# Patient Record
Sex: Female | Born: 2000
Health system: Southern US, Community
[De-identification: ages and names within clinical notes are randomized; demographics above are authoritative.]

## PROBLEM LIST (undated history)

## (undated) DIAGNOSIS — N6320 Unspecified lump in the left breast, unspecified quadrant: Secondary | ICD-10-CM

## (undated) DIAGNOSIS — G43109 Migraine with aura, not intractable, without status migrainosus: Secondary | ICD-10-CM

## (undated) HISTORY — DX: Unspecified lump in the left breast, unspecified quadrant: N63.20

## (undated) HISTORY — DX: Migraine with aura, not intractable, without status migrainosus: G43.109

---
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
## 2019-06-14 ENCOUNTER — Telehealth: Payer: Self-pay

## 2019-06-14 NOTE — Telephone Encounter (Signed)
Patient is in 04 recall for left breast ultrasound. Please contact patient to schedule appointment.

## 2019-06-29 ENCOUNTER — Other Ambulatory Visit: Payer: Self-pay | Admitting: Obstetrics and Gynecology

## 2019-06-29 DIAGNOSIS — N6320 Unspecified lump in the left breast, unspecified quadrant: Secondary | ICD-10-CM

## 2019-06-29 NOTE — Telephone Encounter (Signed)
Spoke with patient and advised it was time to scheduled follow up ultrasound of left breast with [facility].  Patient states she will call today to schedule. Made AEX appointment for 07-11-19 3:00pm.

## 2019-06-30 NOTE — Telephone Encounter (Signed)
Patient scheduled for Lt.Br.U/S 07-08-19 at [facility].

## 2019-07-06 NOTE — Progress Notes (Signed)
18 y.o. G0P0000 Single Caucasian female here for annual exam.    Patient wants to discuss [facility] options;possibly an IUD. She stopped Depo Provera.  Has HA, nausea, cramping, and heavy flow with her cycles.  She stopped Depo Provera due to Covid and not wanting to go to the office for injections. She likes this better than taking a pill.  Her HAs are random but are really painful on her cycles.  She has light sensitivity.  She has an aura.    Student at [employer].   PCP: Jamal Collin, MD   Patient's last menstrual period was 07/07/2019 (exact date).     Period Duration (Days): 7 days Period Pattern: (!) Irregular Menstrual Flow: Heavy Menstrual Control: Maxi pad Menstrual Control Change Freq (Hours): every 3 hours on heaviest day Dysmenorrhea: (!) Moderate(occ severe cramps) Dysmenorrhea Symptoms: Cramping, Nausea, Diarrhea, Headache     Sexually active: No.  The current method of family planning is abstinence    Exercising: Yes.    light cardio. Smoker:  no  Health Maintenance: Pap:  n/a History of abnormal Pap:  n/a MMG: 07-11-19 LT.Br.US--Stable to slight decrease in size of the approximate 7 mm mass;likely a benign fibroadenoma. As the LEFT breast mass has been stable for greater than 2 years, no further imaging follow-up is necessary./BiRads2 Colonoscopy:  n/a BMD:   n/a  Result  n/a TDaP: 11-01-10 Gardasil:   yes HIV:no Hep C:no Screening Labs:  Today.  Flu vaccine completed.   reports that she has never smoked. She has never used smokeless tobacco. She reports current alcohol use of about 1.0 standard drinks of alcohol per week. She reports that she does not use drugs.  Past Medical History:  Diagnosis Date  . Left breast mass    fibroadenoma on imaging    History reviewed. No pertinent surgical history.  Current Outpatient Medications  Medication Sig Dispense Refill  . DULoxetine (CYMBALTA) 60 MG capsule Take 60 mg by mouth daily.    . montelukast  (SINGULAIR) 10 MG tablet Take 10 mg by mouth daily.    Marland Kitchen omeprazole (PRILOSEC) 40 MG capsule Take 40 mg by mouth daily.    Marland Kitchen PROAIR RESPICLICK 108 (90 Base) MCG/ACT AEPB Inhale 1 puff into the lungs as needed.     No current facility-administered medications for this visit.    Family History  Problem Relation Age of Onset  . Asthma Mother   . Endometriosis Mother   . Crohn's disease Sister   . Diabetes Maternal Aunt   . Kidney cancer Paternal Grandfather   . Bone cancer Paternal Grandfather   . Diabetes Other   . Diabetes Other     Review of Systems  All other systems reviewed and are negative.   Exam:   BP 126/74   Pulse 90   Temp 98.1 F (36.7 C) (Temporal)   Resp 16   Ht 5\' 7"  (1.702 m)   Wt 170 lb 3.2 oz (77.2 kg)   LMP 07/07/2019 (Exact Date)   BMI 26.66 kg/m     General appearance: alert, cooperative and appears stated age Head: normocephalic, without obvious abnormality, atraumatic Neck: no adenopathy, supple, symmetrical, trachea midline and thyroid normal to inspection and palpation Lungs: clear to auscultation bilaterally Breasts: normal appearance, no masses or tenderness, No nipple retraction or dimpling, No nipple discharge or bleeding, No axillary adenopathy Heart: regular rate and rhythm Abdomen: soft, non-tender; no masses, no organomegaly Extremities: extremities normal, atraumatic, no cyanosis or edema Skin: skin  color, texture, turgor normal. No rashes or lesions Lymph nodes: cervical, supraclavicular, and axillary nodes normal. Neurologic: grossly normal  Pelvic: External genitalia:  no lesions              No abnormal inguinal nodes palpated.              Urethra:  normal appearing urethra with no masses, tenderness or lesions              Bartholins and Skenes: normal                 Vagina: normal appearing vagina with normal color and discharge, no lesions              Cervix: no lesions              Pap taken: No. Bimanual Exam:  Uterus:   normal size, contour, position, consistency, mobility, non-tender              Adnexa: no mass, fullness, tenderness                Chaperone was present for exam.  Assessment:   Well woman visit with normal exam. 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  Migraine with aura.   Plan: Mammogram screening discussed. Self breast awareness reviewed. Pap and HR HPV as above. Guidelines for Calcium, Vitamin D, regular exercise program including cardiovascular and weight bearing exercise. Routine labs.  She will return for a progesterone IUD.  Risks and benefits of Kyleena and Mirena discussed. Brochures to patient.  Will plan for Cytotec 200 mcg pv at hs the night before and then am of the procedure. She will see her PCP for her migraine HA.  Follow up annually and prn.   After visit summary provided.

## 2019-07-08 ENCOUNTER — Other Ambulatory Visit: Payer: Self-pay

## 2019-07-11 ENCOUNTER — Ambulatory Visit
Admission: RE | Admit: 2019-07-11 | Discharge: 2019-07-11 | Disposition: A | Discharge status: Home / Self Care | Payer: Commercial Managed Care - PPO | Source: Ambulatory Visit | Attending: Obstetrics and Gynecology | Admitting: Obstetrics and Gynecology

## 2019-07-11 ENCOUNTER — Other Ambulatory Visit: Payer: Self-pay

## 2019-07-11 ENCOUNTER — Encounter: Payer: Self-pay | Admitting: Obstetrics and Gynecology

## 2019-07-11 ENCOUNTER — Ambulatory Visit: Payer: Commercial Managed Care - PPO | Admitting: Obstetrics and Gynecology

## 2019-07-11 VITALS — BP 126/74 | HR 90 | Temp 98.1°F | Resp 16 | Ht 67.0 in | Wt 170.2 lb

## 2019-07-11 DIAGNOSIS — Z3009 Encounter for other general counseling and advice on contraception: Secondary | ICD-10-CM

## 2019-07-11 DIAGNOSIS — Z01419 Encounter for gynecological examination (general) (routine) without abnormal findings: Secondary | ICD-10-CM

## 2019-07-11 DIAGNOSIS — N6320 Unspecified lump in the left breast, unspecified quadrant: Secondary | ICD-10-CM

## 2019-07-11 NOTE — Patient Instructions (Signed)

## 2019-07-12 LAB — COMPREHENSIVE METABOLIC PANEL
ALT: 35 IU/L — ABNORMAL HIGH (ref 0–32)
AST: 24 IU/L (ref 0–40)
Albumin/Globulin Ratio: 1.8 (ref 1.2–2.2)
Albumin: 4.5 g/dL (ref 3.9–5.0)
Alkaline Phosphatase: 94 IU/L (ref 39–117)
BUN/Creatinine Ratio: 14 (ref 9–23)
BUN: 10 mg/dL (ref 6–20)
Bilirubin Total: 0.7 mg/dL (ref 0.0–1.2)
CO2: 23 mmol/L (ref 20–29)
Calcium: 9.5 mg/dL (ref 8.7–10.2)
Chloride: 102 mmol/L (ref 96–106)
Creatinine, Ser: 0.69 mg/dL (ref 0.57–1.00)
GFR calc Af Amer: 146 mL/min/{1.73_m2} (ref >59)
GFR calc non Af Amer: 127 mL/min/{1.73_m2} (ref >59)
Globulin, Total: 2.5 g/dL (ref 1.5–4.5)
Glucose: 96 mg/dL (ref 65–99)
Potassium: 3.7 mmol/L (ref 3.5–5.2)
Sodium: 139 mmol/L (ref 134–144)
Total Protein: 7 g/dL (ref 6.0–8.5)

## 2019-07-12 LAB — CBC
Hematocrit: 41.8 % (ref 34.0–46.6)
Hemoglobin: 13.8 g/dL (ref 11.1–15.9)
MCH: 28 pg (ref 26.6–33.0)
MCHC: 33 g/dL (ref 31.5–35.7)
MCV: 85 fL (ref 79–97)
Platelets: 383 10*3/uL (ref 150–450)
RBC: 4.93 x10E6/uL (ref 3.77–5.28)
RDW: 12.6 % (ref 11.7–15.4)
WBC: 8.2 10*3/uL (ref 3.4–10.8)

## 2019-07-12 LAB — TSH: TSH: 1.61 u[IU]/mL (ref 0.450–4.500)

## 2019-07-13 NOTE — Telephone Encounter (Signed)
07-11-19 removed from all mammogram hold and mammogram recall per Dr.Silva. Return to routine screening.BS/JH/Ald

## 2019-07-18 LAB — LIPID PANEL
Chol/HDL Ratio: 3 ratio (ref 0.0–4.4)
Cholesterol, Total: 184 mg/dL — ABNORMAL HIGH (ref 100–169)
HDL: 61 mg/dL (ref >39)
LDL Chol Calc (NIH): 101 mg/dL (ref 0–109)
Triglycerides: 123 mg/dL — ABNORMAL HIGH (ref 0–89)
VLDL Cholesterol Cal: 22 mg/dL (ref 5–40)

## 2019-07-18 LAB — SPECIMEN STATUS REPORT

## 2019-07-19 ENCOUNTER — Encounter: Payer: Self-pay | Admitting: Obstetrics and Gynecology

## 2019-07-20 ENCOUNTER — Other Ambulatory Visit: Payer: Self-pay | Admitting: Obstetrics and Gynecology

## 2019-07-20 DIAGNOSIS — E7800 Pure hypercholesterolemia, unspecified: Secondary | ICD-10-CM

## 2019-07-20 DIAGNOSIS — R748 Abnormal levels of other serum enzymes: Secondary | ICD-10-CM

## 2019-07-24 ENCOUNTER — Encounter: Payer: Self-pay | Admitting: Obstetrics and Gynecology

## 2019-07-25 ENCOUNTER — Telehealth: Payer: Self-pay | Admitting: Obstetrics and Gynecology

## 2019-07-25 NOTE — Telephone Encounter (Signed)
Patient sent the following correspondence through MyChart.  Hi,  I was wondering where we were on the process of me getting my IUD. I go back to school later this month and would prefer to have it done before then if that is at all possible.  Thank you  Cc: Thomasene Lot and Clotilde Dieter

## 2019-07-27 ENCOUNTER — Other Ambulatory Visit: Payer: Self-pay | Admitting: Obstetrics and Gynecology

## 2019-07-27 DIAGNOSIS — Z30430 Encounter for insertion of intrauterine contraceptive device: Secondary | ICD-10-CM

## 2019-07-27 DIAGNOSIS — Z3009 Encounter for other general counseling and advice on contraception: Secondary | ICD-10-CM

## 2019-07-27 NOTE — Telephone Encounter (Signed)
Spoke with pt. Pt wanting to have IUD insertion with Dr Edward Jolly before going back to school on 08/07/2019. Pt scheduled IUD insertion for 08/01/2019 at 1:30pm. Pt aware of call for benefits. Pt instructed to eat and drink and take Motrin 800 mg with food and water one hour before procedure. Pt also mentioned from Dr Courtney Paris OV about taking Cytotec for procedure. Pt also to remain abstinent til appt. Pt verbalized understanding.  Cytotec orders pended if approved. #2, 0RF  Will route to Dr Edward Jolly for review.  Cc: Rosa for update on appt made.

## 2019-07-27 NOTE — Telephone Encounter (Signed)
Call placed to convey benefits for iud insertion. Spoke with the patient and conveyed the benefits. Patient understands/agreeable with the benefits. Patient is unsure when she can schedule the appointment. She will go back to school later this month. °

## 2019-07-27 NOTE — Telephone Encounter (Signed)
Call placed to convey benefits for iud insertion. Spoke with the patient and conveyed the benefits. Patient understands/agreeable with the benefits. Patient is unsure when she can schedule the appointment. She will go back to school later this month.

## 2019-07-28 MED ORDER — MISOPROSTOL 200 MCG PO TABS: ORAL_TABLET | ORAL | 0 refills | Status: DC

## 2019-07-29 ENCOUNTER — Other Ambulatory Visit: Payer: Self-pay

## 2019-08-01 ENCOUNTER — Encounter: Payer: Self-pay | Admitting: Obstetrics and Gynecology

## 2019-08-01 ENCOUNTER — Other Ambulatory Visit: Payer: Self-pay

## 2019-08-01 ENCOUNTER — Ambulatory Visit (INDEPENDENT_AMBULATORY_CARE_PROVIDER_SITE_OTHER): Payer: Commercial Managed Care - PPO | Admitting: Obstetrics and Gynecology

## 2019-08-01 VITALS — BP 104/64 | HR 110 | Temp 97.1°F | Ht 67.0 in | Wt 174.0 lb

## 2019-08-01 DIAGNOSIS — Z3009 Encounter for other general counseling and advice on contraception: Secondary | ICD-10-CM

## 2019-08-01 DIAGNOSIS — Z01812 Encounter for preprocedural laboratory examination: Secondary | ICD-10-CM | POA: Diagnosis not present

## 2019-08-01 LAB — POCT URINE PREGNANCY: Preg Test, Ur: NEGATIVE

## 2019-08-01 NOTE — Progress Notes (Signed)
GYNECOLOGY  VISIT   HPI: 19 y.o.   Single  Caucasian  female   G0P0000 with Patient's last menstrual period was 07/07/2019 (exact date).   here for IUD insertion.  She used Cytotec last hs and this am.  She took Motrin 800 mg prior the visit.  Last meal at 1:00 pm.   UPT: Neg  GYNECOLOGIC HISTORY: Patient's last menstrual period was 07/07/2019 (exact date). 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 As the LEFT breast mass has been stable for greater than 2 years, no further imaging follow-up is necessary./BiRads2 Last pap smear:   n/a        OB History    Gravida  0   Para  0   Term  0   Preterm  0   AB  0   Living  0     SAB  0   TAB  0   Ectopic  0   Multiple  0   Live Births  0              Patient Active Problem List   Diagnosis Date Noted  . Back pain 01/01/2018  . Dysmenorrhea 04/14/2017  . Influenza A 08/20/2016    Past Medical History:  Diagnosis Date  . Elevated ALT measurement 2020  . 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 Migraine with aura     History reviewed. No pertinent surgical history.  Current Outpatient Medications  Medication Sig Dispense Refill  . DULoxetine (CYMBALTA) 60 MG capsule Take 60 mg by mouth daily.    . misoprostol (CYTOTEC) 200 MCG tablet Insert 1 tablet vaginally the night before procedure, then 1 tablet vaginally the morning of procedure. 2 tablet 0  . montelukast (SINGULAIR) 10 MG tablet Take 10 mg by mouth daily.    Marland Kitchen omeprazole (PRILOSEC) 40 MG capsule Take 40 mg by mouth daily.    Marland Kitchen PROAIR RESPICLICK 108 (90 Base) MCG/ACT AEPB Inhale 1 puff into the lungs as needed.     No current facility-administered medications for this visit.     ALLERGIES: Patient has no known allergies.  Family History  Problem Relation Age of Onset  . Asthma Mother   .  Endometriosis Mother   . Crohn's disease Sister   . Diabetes Maternal Aunt   . Kidney cancer Paternal Grandfather   . Bone cancer Paternal Grandfather   . Diabetes Other   . Diabetes Other     Social History   Socioeconomic History  . Marital status: Single    Spouse name: Not on file  . Number of children: Not on file  . Years of education: Not on file  . Highest education level: Not on file  Occupational History  . Not on file  Tobacco Use  . Smoking status: Never Smoker  . Smokeless tobacco: Never Used  Substance and Sexual Activity  . Alcohol use: Yes    Alcohol/week: 1.0 standard drinks    Types: 1 Cans of beer per week  . Drug use: No  . Sexual activity: Never    Birth control/protection: Abstinence  Other Topics Concern  . Not on file  Social History Narrative  . Not on file   Social Determinants of Health   Financial Resource Strain:   . Difficulty of Paying Living Expenses: Not on file  Food Insecurity:   . Worried  About Running Out of Food in the Last Year: Not on file  . Ran Out of Food in the Last Year: Not on file  Transportation Needs:   . Lack of Transportation (Medical): Not on file  . Lack of Transportation (Non-Medical): Not on file  Physical Activity:   . Days of Exercise per Week: Not on file  . Minutes of Exercise per Session: Not on file  Stress:   . Feeling of Stress : Not on file  Social Connections:   . Frequency of Communication with Friends and Family: Not on file  . Frequency of Social Gatherings with Friends and Family: Not on file  . Attends Religious Services: Not on file  . Active Member of Clubs or Organizations: Not on file  . Attends Archivist Meetings: Not on file  . Marital Status: Not on file  Intimate Partner Violence:   . Fear of Current or Ex-Partner: Not on file  . Emotionally Abused: Not on file  . Physically Abused: Not on file  . Sexually Abused: Not on file    Review of Systems See HPI.  PHYSICAL  EXAMINATION:    BP 104/64   Pulse (!) 110   Temp (!) 97.1 F (36.2 C) (Temporal)   Ht 5\' 7"  (1.702 m)   Wt 174 lb (78.9 kg)   LMP 07/07/2019 (Exact Date)   BMI 27.25 kg/m     General appearance: alert, cooperative and appears stated age   Pelvic: External genitalia:  no lesions              Urethra:  normal appearing urethra with no masses, tenderness or lesions              Bartholins and Skenes: normal                 Vagina: normal appearing vagina with normal color and discharge, no lesions              Cervix: no lesions                Bimanual Exam:  Uterus:  normal size, contour, position, consistency, mobility, non-tender              Adnexa: no mass, fullness, tenderness             Consent for Mirena IUD insertion.  Lot [id number], expiration Dec 2022. Speculum placed in vagina.  Sterile prep of cervix with Hibiclens.  Paracervical block with 10 cc 1% lidocaine - lot [id number], expiration 4/22. Tenaculum to anterior cervical lip.  Uterus sounded to just over 7 cm.  Mirena IUD placed without difficulty.  Strings trimmed and shown to patient.  Repeat bimanual exam, no change. No complications.  Minimal EBL.  Chaperone:  Marisa Sprinkles present  ASSESSMENT  Mirena IUD insertion.   PLAN  Instructions and precautions given.  Back up contraception discussed. Card given to patient with insertion date, recommended removal date, and lot number.  Follow up for a recheck in 4 weeks, sooner as needed.  After visit summary to patient.

## 2019-08-01 NOTE — Patient Instructions (Signed)
Intrauterine Device Insertion, Care After  This sheet gives you information about how to care for yourself after your procedure. Your health care provider may also give you more specific instructions. If you have problems or questions, contact your health care provider. What can I expect after the procedure? After the procedure, it is common to have:  Cramps and pain in the abdomen.  Light bleeding (spotting) or heavier bleeding that is like your menstrual period. This may last for up to a few days.  Lower back pain.  Dizziness.  Headaches.  Nausea. Follow these instructions at home:  Before resuming sexual activity, check to make sure that you can feel the IUD string(s). You should be able to feel the end of the string(s) below the opening of your cervix. If your IUD string is in place, you may resume sexual activity. ? If you had a hormonal IUD inserted more than 7 days after your most recent period started, you will need to use a backup method of birth control for 7 days after IUD insertion. Ask your health care provider whether this applies to you.  Continue to check that the IUD is still in place by feeling for the string(s) after every menstrual period, or once a month.  Take over-the-counter and prescription medicines only as told by your health care provider.  Do not drive or use heavy machinery while taking prescription pain medicine.  Keep all follow-up visits as told by your health care provider. This is important. Contact a health care provider if:  You have bleeding that is heavier or lasts longer than a normal menstrual cycle.  You have a fever.  You have cramps or abdominal pain that get worse or do not get better with medicine.  You develop abdominal pain that is new or is not in the same area of earlier cramping and pain.  You feel lightheaded or weak.  You have abnormal or bad-smelling discharge from your vagina.  You have pain during sexual  activity.  You have any of the following problems with your IUD string(s): ? The string bothers or hurts you or your sexual partner. ? You cannot feel the string. ? The string has gotten longer.  You can feel the IUD in your vagina.  You think you may be pregnant, or you miss your menstrual period.  You think you may have an STI (sexually transmitted infection). Get help right away if:  You have flu-like symptoms.  You have a fever and chills.  You can feel that your IUD has slipped out of place. Summary  After the procedure, it is common to have cramps and pain in the abdomen. It is also common to have light bleeding (spotting) or heavier bleeding that is like your menstrual period.  Continue to check that the IUD is still in place by feeling for the string(s) after every menstrual period, or once a month.  Keep all follow-up visits as told by your health care provider. This is important.  Contact your health care provider if you have problems with your IUD string(s), such as the string getting longer or bothering you or your sexual partner. This information is not intended to replace advice given to you by your health care provider. Make sure you discuss any questions you have with your health care provider. Document Revised: 06/19/2017 Document Reviewed: 05/28/2016 Elsevier Patient Education  2020 [employer]  

## 2019-08-17 ENCOUNTER — Telehealth: Payer: Self-pay | Admitting: Obstetrics and Gynecology

## 2019-08-17 ENCOUNTER — Encounter: Payer: Self-pay | Admitting: Obstetrics and Gynecology

## 2019-08-17 NOTE — Telephone Encounter (Signed)
Spoke with patient, advised per Dr. Silva. Patient verbalizes understanding and is agreeable.  Encounter closed.  

## 2019-08-17 NOTE — Telephone Encounter (Signed)
Spoke with patient. Mirena IUD placed 08/01/19. Menses started on 08/07/19 to date. Flow varies, currently changing regular tampon q2-3 hours. Menses like cramps 7/10, worse when sitting, taking ibuprofen prn, this provides relief. Reports headache and nausea with cramps, no vomiting. Denies fever/chills. Recommended OV for further evaluation, patient declines OV this week due to potential inclement weather, lives in [place]. OV scheduled for 08/22/19 at 11:30am with Dr. Edward Jolly. Advised patient if bleeding becomes heavy, changing saturated tampon/pad q1-2 hours or severe pain, seek immediate evaluation at local ER or Urgent Care. Advised patient to return call to office if she desires earlier OV. Will review with Dr. Edward Jolly and return call if any additional recommendations. Patient agreeable.   Routing to Dr. Edward Jolly.

## 2019-08-17 NOTE — Telephone Encounter (Signed)
I recommend ibuprofen 800 mg every 8 hours as needed.  I would also have her try a heating pad.  I will be happy to see her next week.  I agree with your recommendations for care through an ER or urgent care in [facility] for pain and bleeding if needed.

## 2019-08-17 NOTE — Telephone Encounter (Signed)
Patient sent the following correspondence through MyChart.  I got my first period since the IUD on the 17th and I've had it since. It's been very heavy and my cramps and headaches have been bad. I'm concerned that the cramps and bleeding haven't stopped.

## 2019-08-18 ENCOUNTER — Other Ambulatory Visit: Payer: Self-pay

## 2019-08-18 NOTE — Progress Notes (Deleted)
GYNECOLOGY  VISIT   HPI: 19 y.o.   Single  Caucasian  female   G0P0000 with No LMP recorded.   here for follow up after IUD insertion.   GYNECOLOGIC HISTORY: No LMP recorded. 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 Back pain 01/01/2018  . Dysmenorrhea 04/14/2017  . Influenza A 08/20/2016    Past Medical History:  Diagnosis Date  . Elevated ALT measurement 2020  . 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 Migraine with aura     No past surgical history on file.  Current Outpatient Medications  Medication Sig Dispense Refill  . DULoxetine (CYMBALTA) 60 MG capsule Take 60 mg by mouth daily.    . misoprostol (CYTOTEC) 200 MCG tablet Insert 1 tablet vaginally the night before procedure, then 1 tablet vaginally the morning of procedure. 2 tablet 0  . montelukast (SINGULAIR) 10 MG tablet Take 10 mg by mouth daily.    Marland Kitchen omeprazole (PRILOSEC) 40 MG capsule Take 40 mg by mouth daily.    Marland Kitchen PROAIR RESPICLICK 108 (90 Base) MCG/ACT AEPB Inhale 1 puff into the lungs as needed.     No current facility-administered medications for this visit.     ALLERGIES: Patient has no known allergies.  Family History  Problem Relation Age of Onset  . Asthma Mother   . Endometriosis Mother   . Crohn's disease Sister   . Diabetes Maternal Aunt   . Kidney cancer Paternal Grandfather   . Bone cancer Paternal Grandfather   . Diabetes Other   . Diabetes Other      Social History   Socioeconomic History  . Marital status: Single    Spouse name: Not on file  . Number of children: Not on file  . Years of education: Not on file  . Highest education level: Not on file  Occupational History  . Not on file  Tobacco Use  . Smoking status: Never Smoker  . Smokeless tobacco: Never Used  Substance and Sexual Activity  . Alcohol use: Yes    Alcohol/week: 1.0 standard drinks    Types: 1 Cans of beer per week  . Drug use: No  . Sexual activity: Never    Birth control/protection: Abstinence  Other Topics Concern  . Not on file  Social History Narrative  . Not on file   Social Determinants of Health   Financial Resource Strain:   . Difficulty of Paying Living Expenses: Not on file  Food Insecurity:   . Worried About Programme researcher, broadcasting/film/video in the Last Year: Not on file  . Ran Out of Food in the Last Year: Not on file  Transportation Needs:   . Lack of Transportation (Medical):  Not on file  . Lack of Transportation (Non-Medical): Not on file  Physical Activity:   . Days of Exercise per Week: Not on file  . Minutes of Exercise per Session: Not on file  Stress:   . Feeling of Stress : Not on file  Social Connections:   . Frequency of Communication with Friends and Family: Not on file  . Frequency of Social Gatherings with Friends and Family: Not on file  . Attends Religious Services: Not on file  . Active Member of Clubs or Organizations: Not on file  . Attends Archivist Meetings: Not on file  . Marital Status: Not on file  Intimate Partner Violence:   . Fear of Current or Ex-Partner: Not on file  . Emotionally Abused: Not on file  . Physically Abused: Not on file  . Sexually Abused: Not on file    Review of Systems  PHYSICAL EXAMINATION:    There were no vitals taken for this visit.    General appearance: alert, cooperative and appears stated age Head: Normocephalic, without obvious abnormality, atraumatic Neck: no  adenopathy, supple, symmetrical, trachea midline and thyroid normal to inspection and palpation Lungs: clear to auscultation bilaterally Breasts: normal appearance, no masses or tenderness, No nipple retraction or dimpling, No nipple discharge or bleeding, No axillary or supraclavicular adenopathy Heart: regular rate and rhythm Abdomen: soft, non-tender, no masses,  no organomegaly Extremities: extremities normal, atraumatic, no cyanosis or edema Skin: Skin color, texture, turgor normal. No rashes or lesions Lymph nodes: Cervical, supraclavicular, and axillary nodes normal. No abnormal inguinal nodes palpated Neurologic: Grossly normal  Pelvic: External genitalia:  no lesions              Urethra:  normal appearing urethra with no masses, tenderness or lesions              Bartholins and Skenes: normal                 Vagina: normal appearing vagina with normal color and discharge, no lesions              Cervix: no lesions                Bimanual Exam:  Uterus:  normal size, contour, position, consistency, mobility, non-tender              Adnexa: no mass, fullness, tenderness              Rectal exam: {yes no:[id number]}.  Confirms.              Anus:  normal sphincter tone, no lesions  Chaperone was present for exam.  ASSESSMENT     PLAN     An After Visit Summary was printed and given to the patient.  ______ minutes face to face time of which over 50% was spent in counseling.

## 2019-08-22 ENCOUNTER — Telehealth: Payer: Self-pay | Admitting: Obstetrics and Gynecology

## 2019-08-22 ENCOUNTER — Ambulatory Visit: Payer: Self-pay | Admitting: Obstetrics and Gynecology

## 2019-08-22 NOTE — Telephone Encounter (Signed)
Patient cancelled today's appointment due to possible covid exposure. States she was coming in for a problem visit, but the issues she was having have gone away. Keeping appointment for 08/29/19 at this time, will get update during covid prescreen call for this appointment.

## 2019-08-22 NOTE — Telephone Encounter (Signed)
Thank you for the update!

## 2019-08-25 NOTE — Progress Notes (Signed)
GYNECOLOGY  VISIT   HPI: 19 y.o.   Single  Caucasian  female   G0P0000 with Patient's last menstrual period was 08/07/2019 (exact date).   here for 4 week follow up after Mirena IUD insertion.  She is also having fasting labs. Her total cholesterol, triglycerides, at ALT were elevated on 07/11/19.   Period lasted 2.5 weeks.  Occasional spotting and cramping.   GYNECOLOGIC HISTORY: Patient's last menstrual period was 08/07/2019 (exact date). Contraception:  Mirena IUD 08-01-19 Menopausal hormone therapy:  n/a Last mammogram:  n/a Last pap smear: n/a        OB History    Gravida  0   Para  0   Term  0   Preterm  0   AB  0   Living  0     SAB  0   TAB  0   Ectopic  0   Multiple  0   Live Births  0              Patient Active Problem List   Diagnosis Date Noted  . Back pain 01/01/2018  . Dysmenorrhea 04/14/2017  . Influenza A 08/20/2016    Past Medical History:  Diagnosis Date  . Elevated ALT measurement 2020  . 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 Migraine with aura     History reviewed. No pertinent surgical history.  Current Outpatient Medications  Medication Sig Dispense Refill  . ARIPiprazole (ABILIFY) 2 MG tablet Take 1 tablet by mouth daily.    . DULoxetine (CYMBALTA) 60 MG capsule Take 60 mg by mouth daily.    . montelukast (SINGULAIR) 10 MG tablet Take 10 mg by mouth daily.    Marland Kitchen omeprazole (PRILOSEC) 40 MG capsule Take 40 mg by mouth daily.    Marland Kitchen PROAIR RESPICLICK 108 (90 Base) MCG/ACT AEPB Inhale 1 puff into the lungs as needed.     No current facility-administered medications for this visit.     ALLERGIES: Patient has no known allergies.  Family History  Problem Relation Age of Onset  . Asthma Mother   . Endometriosis Mother   . Crohn's disease Sister   . Diabetes Maternal Aunt   . Kidney cancer Paternal Grandfather   . Bone cancer Paternal Grandfather   . Diabetes Other   . Diabetes Other     Social History    Socioeconomic History  . Marital status: Single    Spouse name: Not on file  . Number of children: Not on file  . Years of education: Not on file  . Highest education level: Not on file  Occupational History  . Not on file  Tobacco Use  . Smoking status: Never Smoker  . Smokeless tobacco: Never Used  Substance and Sexual Activity  . Alcohol use: Yes    Alcohol/week: 1.0 standard drinks    Types: 1 Cans of beer per week  . Drug use: No  . Sexual activity: Never    Birth control/protection: Abstinence  Other Topics Concern  . Not on file  Social History Narrative  . Not on file   Social Determinants of Health   Financial Resource Strain:   . Difficulty of Paying Living Expenses: Not on file  Food Insecurity:   . Worried About Programme researcher, broadcasting/film/video in the Last Year: Not on file  . Ran Out of Food in the Last Year: Not on file  Transportation Needs:   . Lack of Transportation (Medical): Not  on file  . Lack of Transportation (Non-Medical): Not on file  Physical Activity:   . Days of Exercise per Week: Not on file  . Minutes of Exercise per Session: Not on file  Stress:   . Feeling of Stress : Not on file  Social Connections:   . Frequency of Communication with Friends and Family: Not on file  . Frequency of Social Gatherings with Friends and Family: Not on file  . Attends Religious Services: Not on file  . Active Member of Clubs or Organizations: Not on file  . Attends Archivist Meetings: Not on file  . Marital Status: Not on file  Intimate Partner Violence:   . Fear of Current or Ex-Partner: Not on file  . Emotionally Abused: Not on file  . Physically Abused: Not on file  . Sexually Abused: Not on file    Review of Systems  All other systems reviewed and are negative.   PHYSICAL EXAMINATION:    BP 110/74   Pulse 76   Temp (!) 97.3 F (36.3 C) (Temporal)   Ht 5\' 7"  (1.702 m)   Wt 172 lb (78 kg)   LMP 08/07/2019 (Exact Date)   BMI 26.94 kg/m      General appearance: alert, cooperative and appears stated age    Pelvic: External genitalia:  no lesions              Urethra:  normal appearing urethra with no masses, tenderness or lesions              Bartholins and Skenes: normal                 Vagina: normal appearing vagina with normal color and discharge, no lesions              Cervix: no lesions.  Mild menstrual blood noted.  IUD strings present.                 Bimanual Exam:  Uterus:  normal size, contour, position, consistency, mobility, non-tender              Adnexa: no mass, fullness, tenderness                Chaperone was present for exam.  ASSESSMENT  Mirena IUD check up.  Elevated cholesterol and triglycerides.  Elevated ALT.  PLAN  Bleeding profiles with Mirena discussed. Reassurance regarding IUD position. I discussed checking the strings. Condom use for STD prevention reviewed.  Potential for ectopic pregnancy discussed.  Fasting labs today. FU for annual exam and prn.   An After Visit Summary was printed and given to the patient.  __15____ minutes face to face time of which over 50% was spent in counseling.

## 2019-08-26 ENCOUNTER — Other Ambulatory Visit: Payer: Self-pay

## 2019-08-29 ENCOUNTER — Ambulatory Visit: Payer: Commercial Managed Care - PPO | Admitting: Obstetrics and Gynecology

## 2019-08-29 ENCOUNTER — Other Ambulatory Visit: Payer: Self-pay

## 2019-08-29 ENCOUNTER — Other Ambulatory Visit: Payer: Commercial Managed Care - PPO

## 2019-08-29 ENCOUNTER — Encounter: Payer: Self-pay | Admitting: Obstetrics and Gynecology

## 2019-08-29 VITALS — BP 110/74 | HR 76 | Temp 97.3°F | Ht 67.0 in | Wt 172.0 lb

## 2019-08-29 DIAGNOSIS — Z30431 Encounter for routine checking of intrauterine contraceptive device: Secondary | ICD-10-CM

## 2019-08-29 DIAGNOSIS — R748 Abnormal levels of other serum enzymes: Secondary | ICD-10-CM

## 2019-08-29 DIAGNOSIS — E7800 Pure hypercholesterolemia, unspecified: Secondary | ICD-10-CM

## 2019-08-30 ENCOUNTER — Other Ambulatory Visit: Payer: Self-pay

## 2019-08-30 LAB — HEPATIC FUNCTION PANEL
ALT: 12 IU/L (ref 0–32)
AST: 13 IU/L (ref 0–40)
Albumin: 4.5 g/dL (ref 3.9–5.0)
Alkaline Phosphatase: 91 IU/L (ref 39–117)
Bilirubin Total: 1 mg/dL (ref 0.0–1.2)
Bilirubin, Direct: 0.21 mg/dL (ref 0.00–0.40)
Total Protein: 7 g/dL (ref 6.0–8.5)

## 2019-08-30 LAB — LIPID PANEL
Chol/HDL Ratio: 3 ratio (ref 0.0–4.4)
Cholesterol, Total: 170 mg/dL — ABNORMAL HIGH (ref 100–169)
HDL: 56 mg/dL (ref >39)
LDL Chol Calc (NIH): 101 mg/dL (ref 0–109)
Triglycerides: 69 mg/dL (ref 0–89)
VLDL Cholesterol Cal: 13 mg/dL (ref 5–40)

## 2019-10-03 ENCOUNTER — Encounter: Payer: Self-pay | Admitting: Certified Nurse Midwife

## 2019-10-05 ENCOUNTER — Encounter: Payer: Self-pay | Admitting: Certified Nurse Midwife

## 2020-06-11 IMAGING — US US BREAST*L* LIMITED INC AXILLA
1 series · 5 of 5 positions shown · non-contrast
Comparison: 10/19/2017, 01/13/2017.

CLINICAL DATA: 18-year-old presenting for a 2+ year interval
follow-up of a likely benign LEFT breast mass, likely a
fibroadenoma.

EXAM:
ULTRASOUND OF THE LEFT BREAST

[Series 1: us breast*left* limited inc axilla · 0.05mm/px · 5 of 5 slices shown]
[im 1/5]
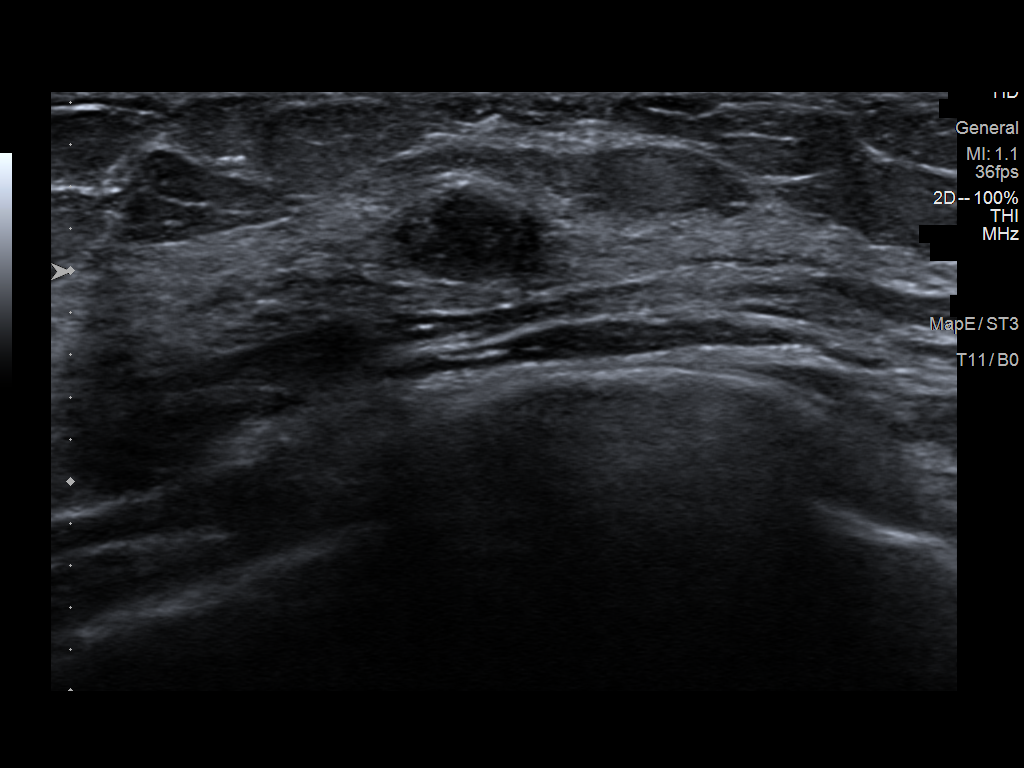
[im 2/5]
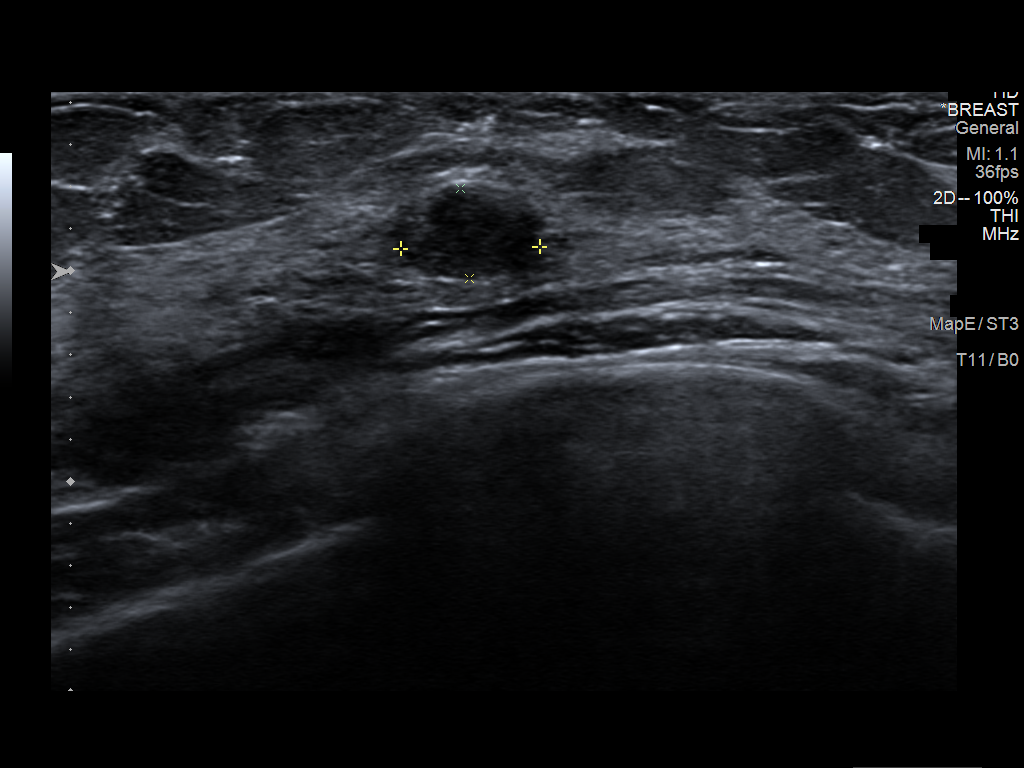
[im 3/5]
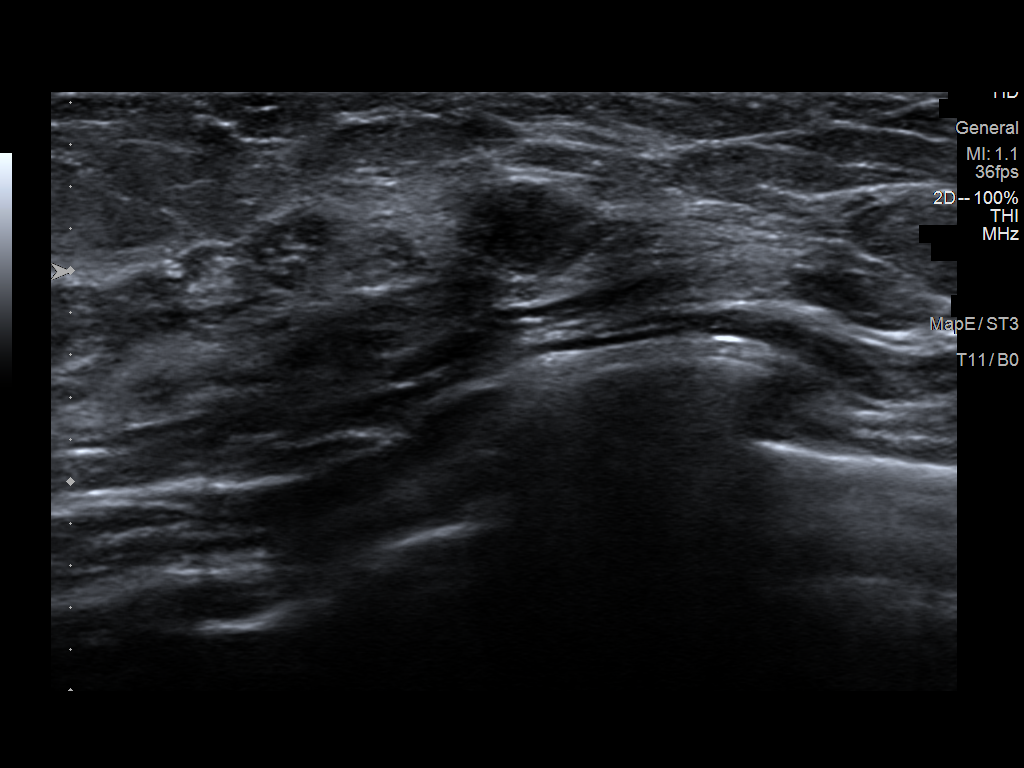
[im 4/5]
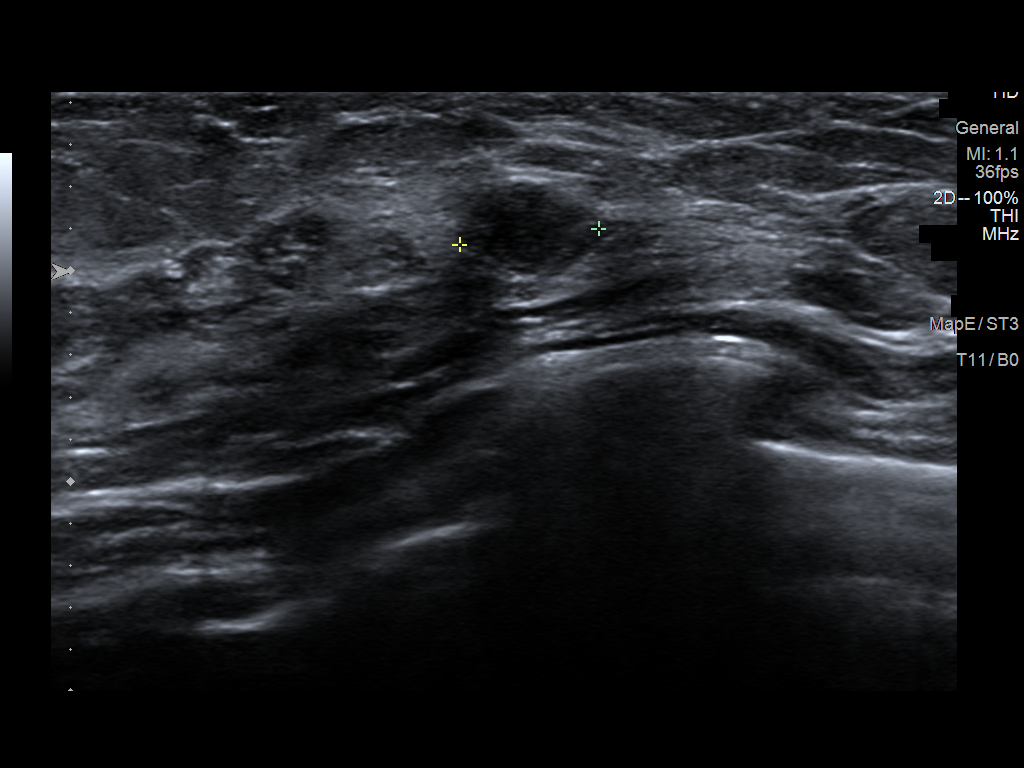
[im 5/5]
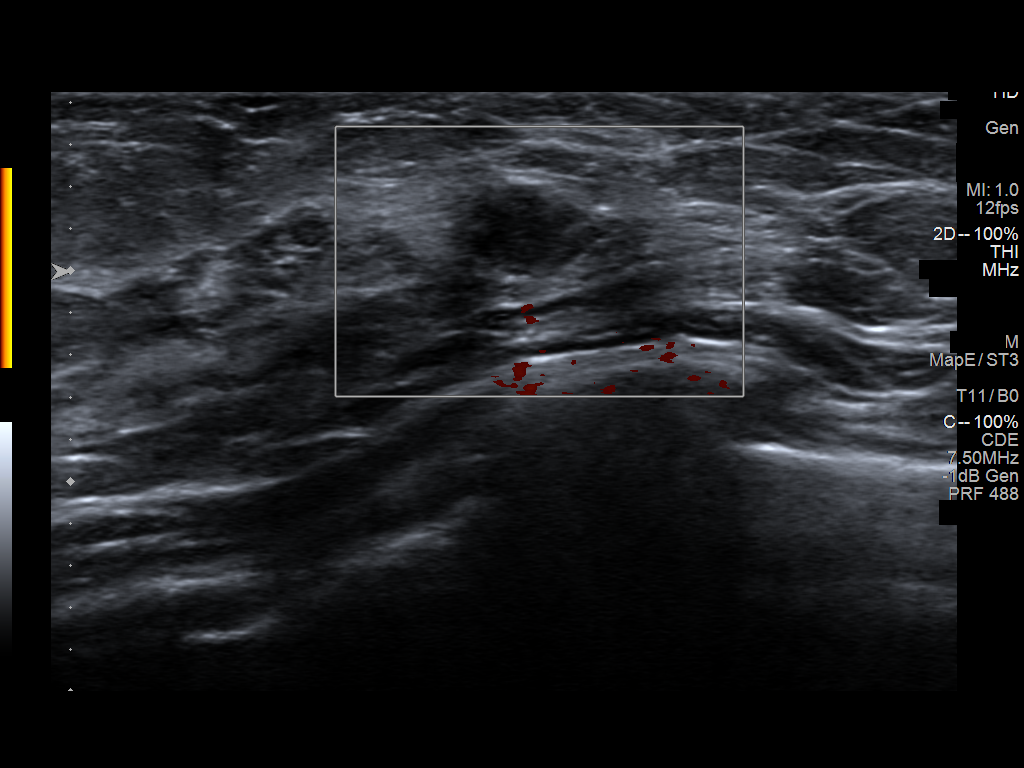

[5 of 5 positions shown; findings below may reference images not displayed]

FINDINGS: Targeted ultrasound is performed, showing the previously identified
circumscribed oval parallel hypoechoic mass at the 5 o'clock
position approximately 6 cm from the nipple, currently measuring
approximately 4 x 7 x 7 mm (5 x 8 x 7 mm on the original 01/13/2017
ultrasound), demonstrating no posterior characteristics and no
internal power Doppler flow.
IMPRESSION: Stable to slight decrease in size of the approximate 7 mm mass
involving the LOWER OUTER QUADRANT of the LEFT breast since the
December 2016 ultrasound, likely a benign fibroadenoma.

RECOMMENDATION:
As the LEFT breast mass has been stable for greater than 2 years, no
further imaging follow-up is necessary. Screening mammogram at age
39 is recommended unless there are persistent or intervening
clinical concerns. (Code:IH-H-266)

I have discussed the findings and recommendations with the patient.

BI-RADS CATEGORY  2: Benign.

## 2020-07-04 ENCOUNTER — Telehealth: Payer: Self-pay

## 2020-07-04 NOTE — Telephone Encounter (Signed)
Left message on voicemail to call and reschedule cancelled appointment. °

## 2020-07-23 ENCOUNTER — Ambulatory Visit: Payer: Self-pay | Admitting: Obstetrics and Gynecology
